# Patient Record
Sex: Female | Born: 1990 | Race: White | Hispanic: No | Marital: Single | Smoking: Never smoker
Health system: Southern US, Community
[De-identification: ages and names within clinical notes are randomized; demographics above are authoritative.]

## PROBLEM LIST (undated history)

## (undated) DIAGNOSIS — F329 Major depressive disorder, single episode, unspecified: Secondary | ICD-10-CM

## (undated) DIAGNOSIS — N289 Disorder of kidney and ureter, unspecified: Secondary | ICD-10-CM

## (undated) DIAGNOSIS — M1990 Unspecified osteoarthritis, unspecified site: Secondary | ICD-10-CM

## (undated) DIAGNOSIS — F419 Anxiety disorder, unspecified: Secondary | ICD-10-CM

## (undated) DIAGNOSIS — F32A Depression, unspecified: Secondary | ICD-10-CM

---
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
## 2010-03-09 ENCOUNTER — Encounter: Payer: Self-pay | Admitting: Orthopedic Surgery

## 2010-10-08 IMAGING — US US EXTREM LOW NON VASC*R*
1 series · 9 of 9 positions shown · non-contrast
Comparison: Ultrasound scanning in similar region of asymptomatic
left wrist.

CLINICAL DATA: Painful palpable bulge at the palmar right wrist.
Assessment for possible cyst.

RIGHT LOWER EXTREMITY SOFT TISSUE ULTRASOUND
TECHNIQUE: Ultrasound examination of the soft tissues was
performed in the area of clinical concern.

[Series 1: us extrem low non vasc*right* · 0.06mm/px · 9 of 9 slices shown]
[im 1/9]
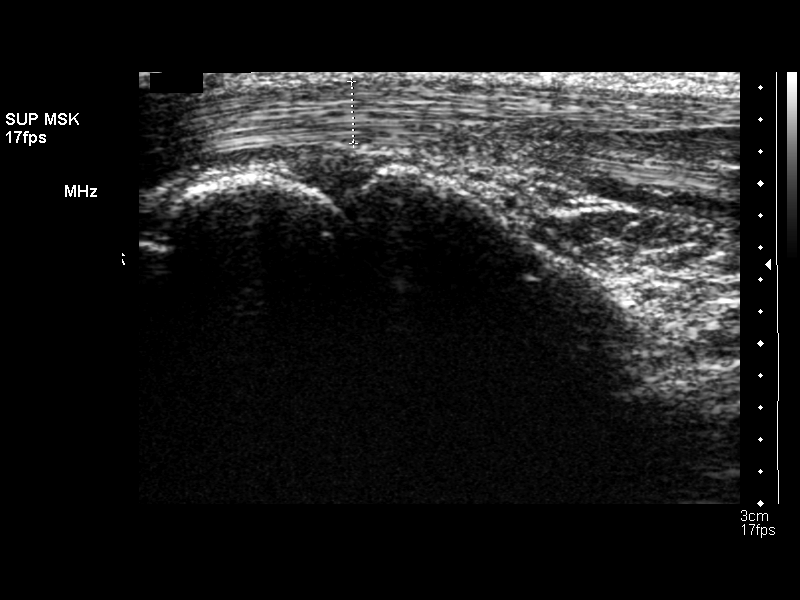
[im 2/9]
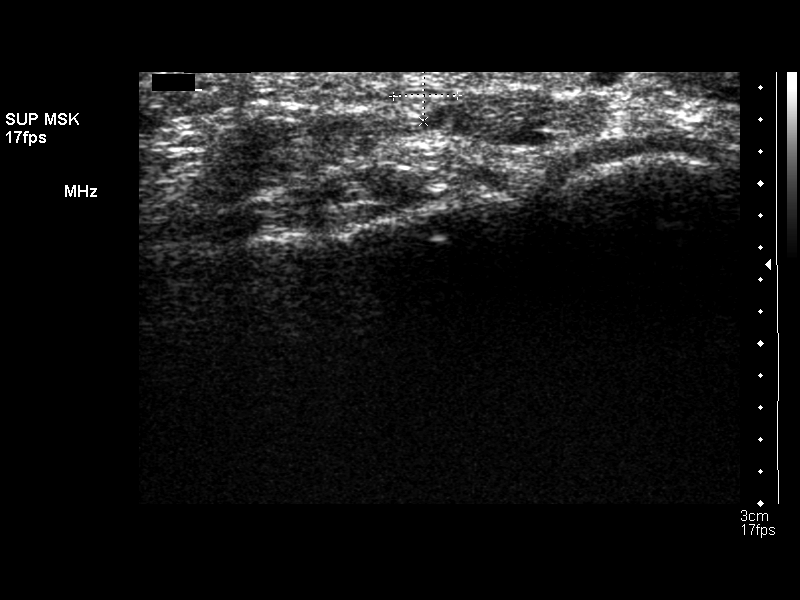
[im 3/9]
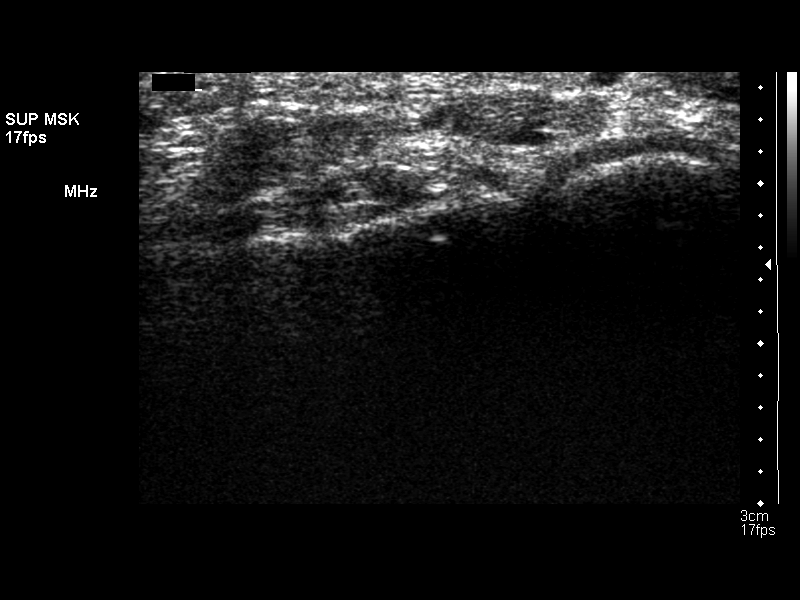
[im 4/9]
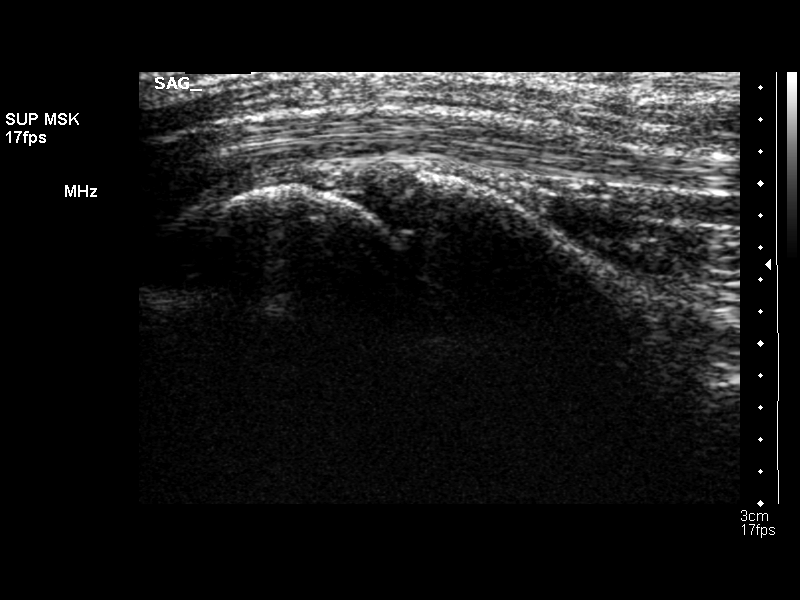
[im 5/9]
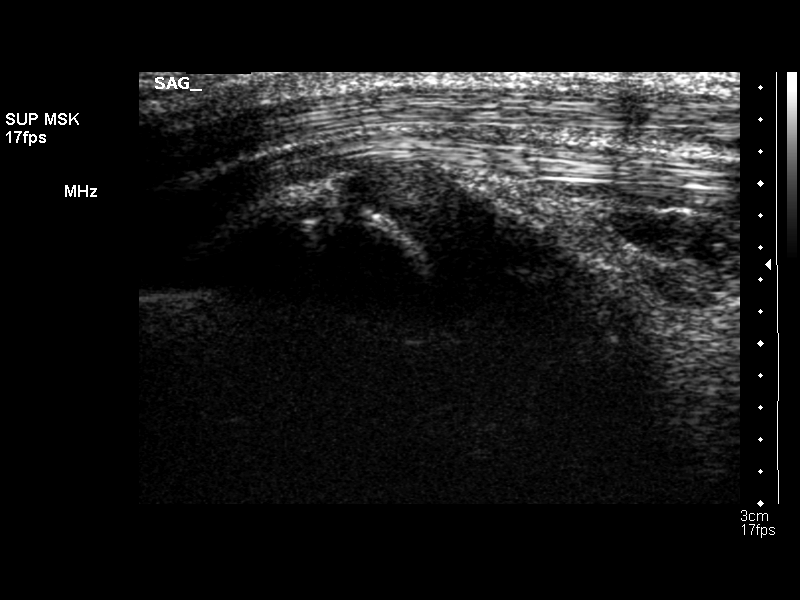
[im 6/9]
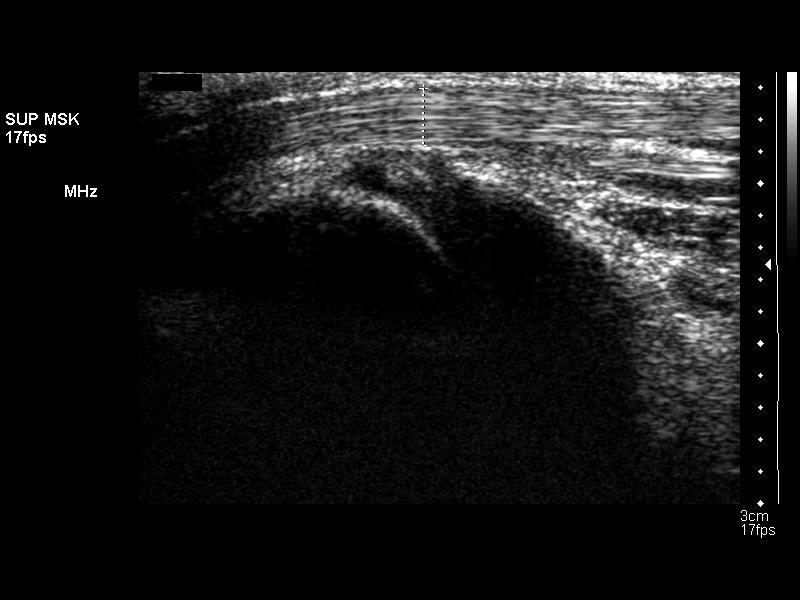
[im 7/9]
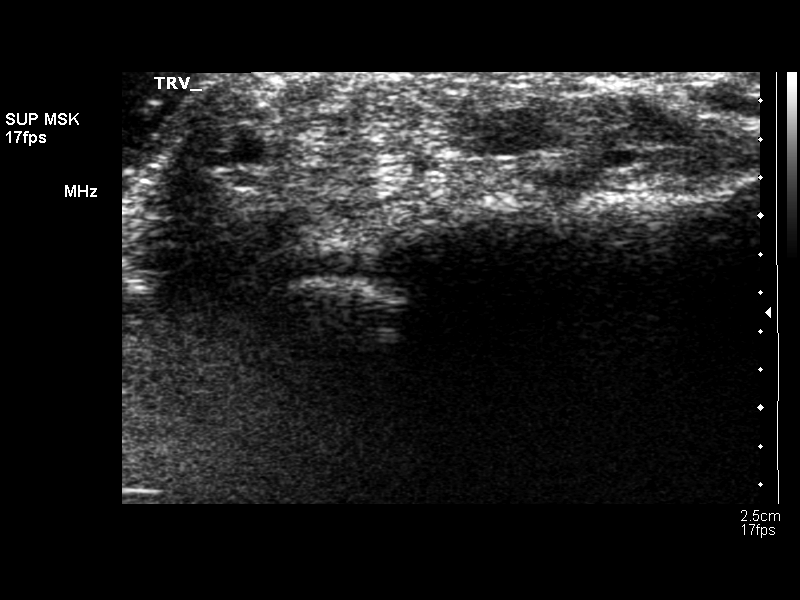
[im 8/9]
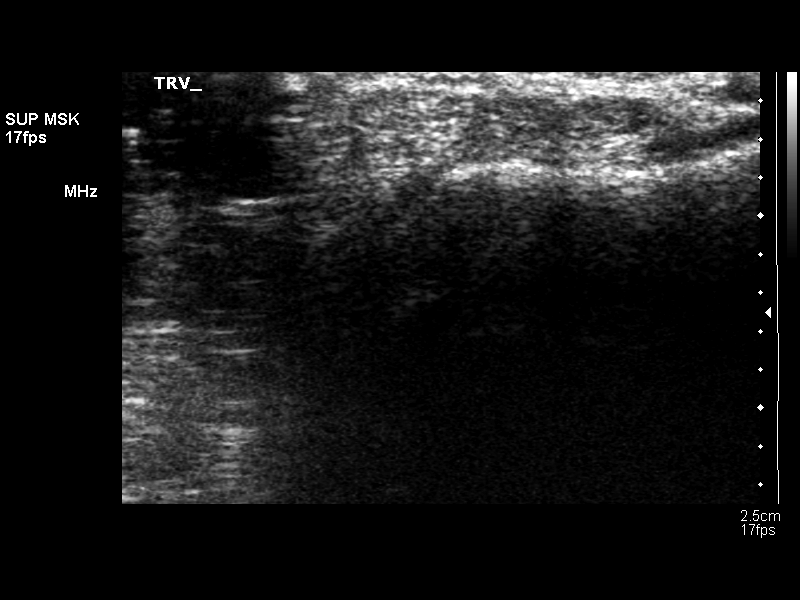
[im 9/9]
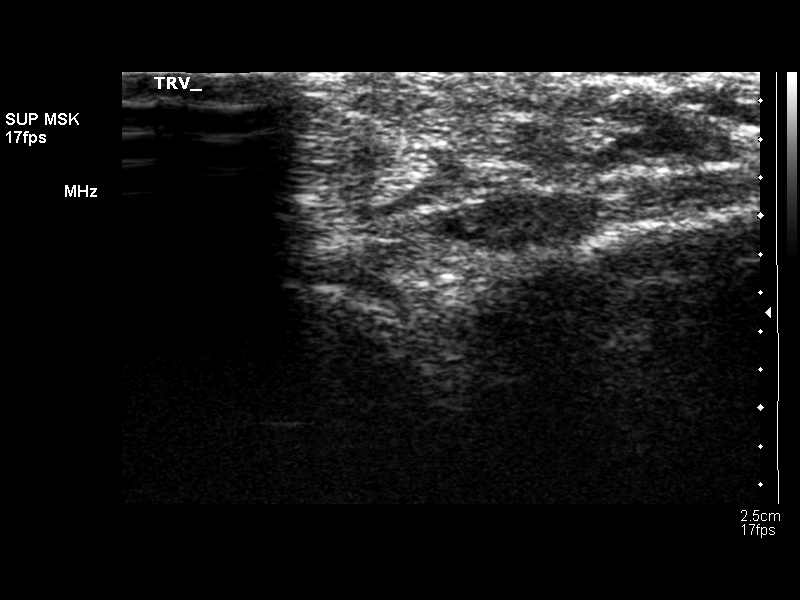

[9 of 9 positions shown; findings below may reference images not displayed]

FINDINGS: No sonographic cystic lesion is seen in region of the
palmar right wrist symptomatic area as indicated by patient.
Bilateral flexor tendons appear symmetrical and sonographically
normal. Normal vascular anatomy visualized.
IMPRESSION: Negative.

## 2018-12-24 ENCOUNTER — Emergency Department
Admission: EM | Admit: 2018-12-24 | Discharge: 2018-12-24 | Disposition: A | Discharge status: Home / Self Care | Payer: Self-pay | Source: Home / Self Care | Attending: Family Medicine | Admitting: Family Medicine

## 2018-12-24 ENCOUNTER — Other Ambulatory Visit: Payer: Self-pay

## 2018-12-24 ENCOUNTER — Encounter: Payer: Self-pay | Admitting: Emergency Medicine

## 2018-12-24 ENCOUNTER — Emergency Department (INDEPENDENT_AMBULATORY_CARE_PROVIDER_SITE_OTHER): Payer: Managed Care, Other (non HMO)

## 2018-12-24 DIAGNOSIS — S92501A Displaced unspecified fracture of right lesser toe(s), initial encounter for closed fracture: Secondary | ICD-10-CM | POA: Diagnosis not present

## 2018-12-24 DIAGNOSIS — S92531A Displaced fracture of distal phalanx of right lesser toe(s), initial encounter for closed fracture: Secondary | ICD-10-CM | POA: Diagnosis not present

## 2018-12-24 DIAGNOSIS — W5519XA Other contact with horse, initial encounter: Secondary | ICD-10-CM

## 2018-12-24 HISTORY — DX: Anxiety disorder, unspecified: F41.9

## 2018-12-24 HISTORY — DX: Depression, unspecified: F32.A

## 2018-12-24 HISTORY — DX: Unspecified osteoarthritis, unspecified site: M19.90

## 2018-12-24 HISTORY — DX: Disorder of kidney and ureter, unspecified: N28.9

## 2018-12-24 NOTE — Discharge Instructions (Addendum)
Apply ice pack for 10 to 15 minutes, 3 to 4 times daily  Continue until pain and swelling decrease. May take Tylenol daytime as necessary for pain. Elevate foot whenever possible.  Wear post-op shoe.  Use crutches.

## 2018-12-24 NOTE — ED Notes (Signed)
Patient does not want Tdap; she says her foot was in sock and boot when horse stepped on it.

## 2018-12-24 NOTE — ED Provider Notes (Signed)
[facility]    CSN: [id number] Arrival date & time: 12/24/18  1003      History   Chief Complaint Chief Complaint  Patient presents with  . Foot Injury    right    HPI Tracy Huffman is a 28 y.o. female.   Patient reports that a horse stepped on her right lateral foot yesterday, and she has had persistent pain/swelling.   Foot Injury Location:  Foot Time since incident:  1 day Injury: yes   Mechanism of injury comment:  Horse stepped on foot Foot location:  Dorsum of R foot Pain details:    Quality:  Aching   Radiates to:  Does not radiate   Severity:  Severe   Onset quality:  Sudden   Duration:  1 day   Timing:  Constant   Progression:  Unchanged Chronicity:  New Prior injury to area:  No Relieved by:  Nothing Worsened by:  Bearing weight Ineffective treatments:  NSAIDs Associated symptoms: decreased ROM, stiffness and swelling   Associated symptoms: no numbness and no tingling     Past Medical History:  Diagnosis Date  . Anxiety and depression   . Arthritis   . Renal disorder    stone x 1    Patient Active Problem List   Diagnosis Date Noted  . ALLERGIC RHINITIS 06/06/2008  . ASTHMA 06/06/2008    History reviewed. No pertinent surgical history.  OB History   No obstetric history on file.      Home Medications    Prior to Admission medications   Medication Sig Start Date End Date Taking? Authorizing Provider  busPIRone (BUSPAR) 7.5 MG tablet Take 7.5 mg by mouth 3 (three) times daily.   Yes [provider]  diclofenac (CATAFLAM) 50 MG tablet Take 75 mg by mouth 3 (three) times daily.   Yes [provider]  etonogestrel (NEXPLANON) 68 MG IMPL implant 1 each by Subdermal route once.   Yes [provider]  sertraline (ZOLOFT) 100 MG tablet Take 100 mg by mouth daily.   Yes [provider]    Family History No family history on file.  Social History Social History   Tobacco Use  .  Smoking status: Never Smoker  . Smokeless tobacco: Never Used  Substance Use Topics  . Alcohol use: Never    Frequency: Never  . Drug use: Not on file     Allergies   Erythromycin   Review of Systems Review of Systems  Musculoskeletal: Positive for stiffness.  All other systems reviewed and are negative.    Physical Exam Triage Vital Signs ED Triage Vitals  Enc Vitals Group     BP 12/24/18 1041 106/72     Pulse Rate 12/24/18 1041 86     Resp 12/24/18 1041 16     Temp 12/24/18 1041 98.5 F (36.9 C)     Temp Source 12/24/18 1041 Oral     SpO2 12/24/18 1041 98 %     Weight 12/24/18 1042 145 lb (65.8 kg)     Height 12/24/18 1042 5' 1.5" (1.562 m)     Head Circumference --      Peak Flow --      Pain Score 12/24/18 1042 7     Pain Loc --      Pain Edu? --      Excl. in [place]? --    No data found.  Updated Vital Signs BP 106/72 (BP Location: Right Arm)   Pulse  86   Temp 98.5 F (36.9 C) (Oral)   Resp 16   Ht 5' 1.5" (1.562 m)   Wt 65.8 kg   SpO2 98%   BMI 26.95 kg/m   Visual Acuity Right Eye Distance:   Left Eye Distance:   Bilateral Distance:    Right Eye Near:   Left Eye Near:    Bilateral Near:     Physical Exam Vitals signs and nursing note reviewed.  Constitutional:      General: She is not in acute distress. HENT:     Head: Normocephalic.  Eyes:     Pupils: Pupils are equal, round, and reactive to light.  Cardiovascular:     Rate and Rhythm: Normal rate.  Pulmonary:     Effort: Pulmonary effort is normal.  Musculoskeletal:     Right foot: Decreased range of motion. Tenderness, bony tenderness and swelling present.       Feet:     Comments: Fifth toe of right foot has swelling and tenderness to palpation over the distal phalanx.  Neurovascular function is intact.   Skin:    General: Skin is warm and dry.  Neurological:     Mental Status: She is alert.      UC Treatments / Results  Labs (all labs ordered are listed, but only  abnormal results are displayed) Labs Reviewed - No data to display  EKG   Radiology Dg Foot Complete Right  Result Date: 12/24/2018 CLINICAL DATA:  Horse stepped on right foot yesterday with fifth toe pain and swelling. EXAM: RIGHT FOOT COMPLETE - 3+ VIEW COMPARISON:  None. FINDINGS: Examination demonstrates a displaced transverse fracture through the midportion of the fifth distal phalanx. Remainder of the exam is unremarkable. IMPRESSION: Minimally displaced transverse fracture of the fifth distal phalanx. Electronically Signed   By: Elberta Fortis M.D.   On: 12/24/2018 11:24    Procedures Procedures (including critical care time)  Medications Ordered in UC Medications - No data to display  Initial Impression / Assessment and Plan / UC Course  I have reviewed the triage vital signs and the nursing notes.  Pertinent labs & imaging results that were available during my care of the patient were reviewed by me and considered in my medical decision making (see chart for details).    Dispensed post-op shoe and crutches. Followup with Dr. [facility] ([facility] in about 3 days.   Final Clinical Impressions(s) / UC Diagnoses   Final diagnoses:  Closed fracture of phalanx of right fifth toe, initial encounter     Discharge Instructions     Apply ice pack for 10 to 15 minutes, 3 to 4 times daily  Continue until pain and swelling decrease. May take Tylenol daytime as necessary for pain. Elevate foot whenever possible.  Wear post-op shoe.  Use crutches.    ED Prescriptions    None        Lattie Haw, MD 12/29/18 1327

## 2018-12-24 NOTE — ED Triage Notes (Signed)
Patient here for right foot injury; horse stepped on it yesterday. Uncertain about last Tdap and there is abrasion on small toe. She took her diclofenac for pain this morning; she is in wheelchair. She has not had her influenza vacc this season.

## 2020-12-21 IMAGING — DX DG FOOT COMPLETE 3+V*R*
2 series · 2 of 2 positions shown · non-contrast
Comparison: None.

CLINICAL DATA: Horse stepped on right foot yesterday with fifth toe
pain and swelling.

EXAM:
RIGHT FOOT COMPLETE - 3+ VIEW

[foot obl]
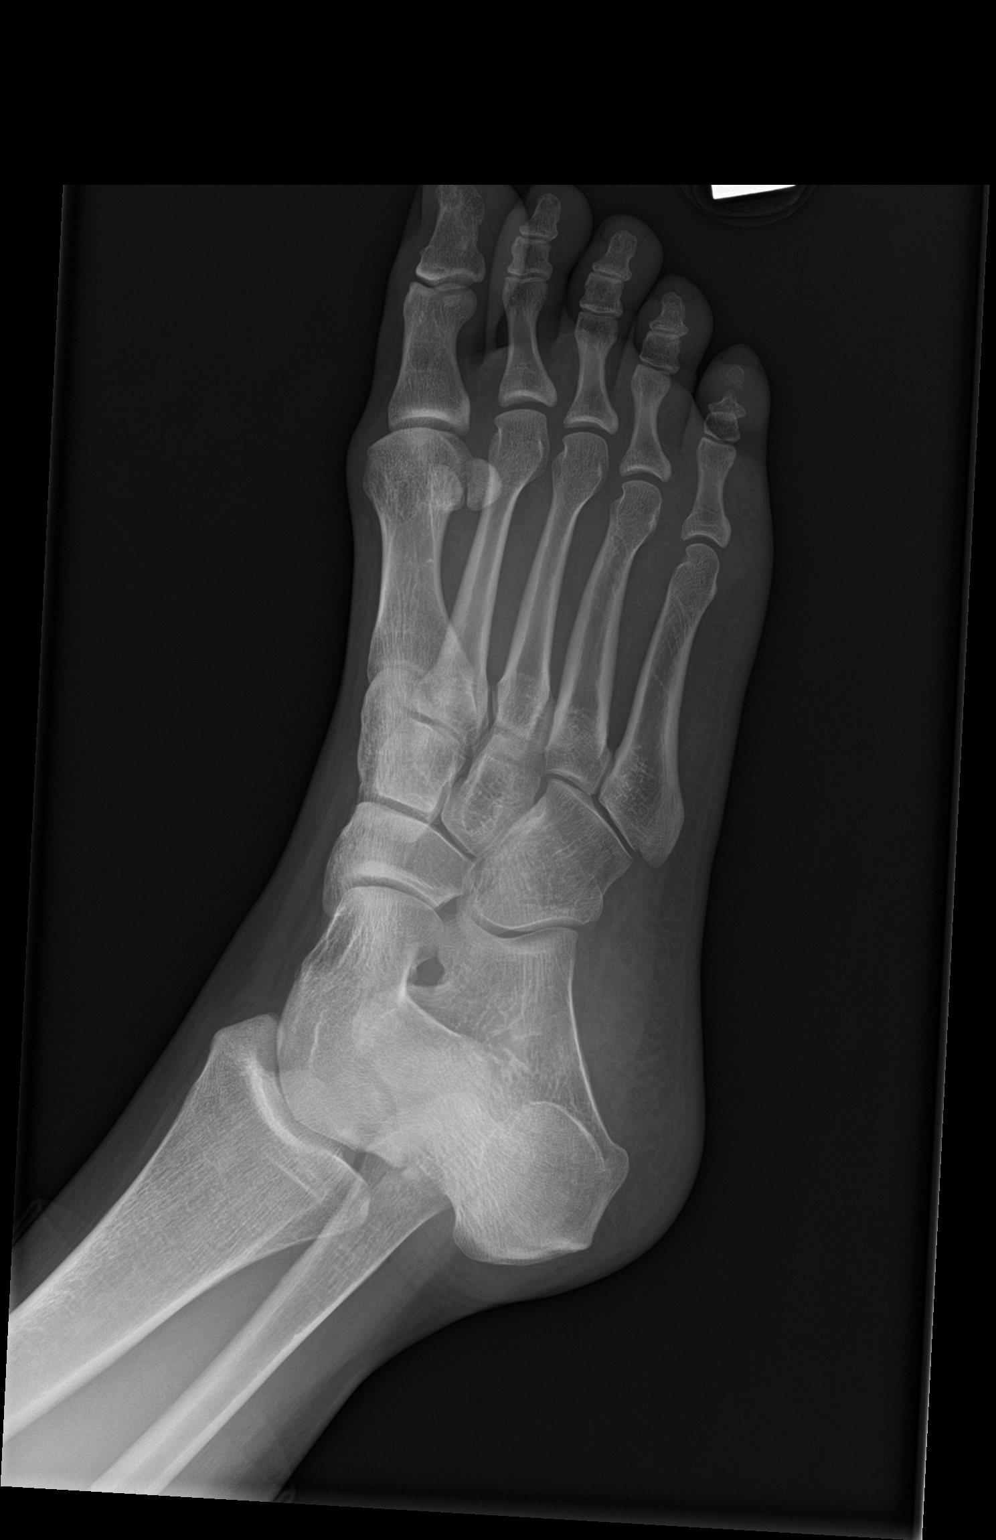

[foot lat]
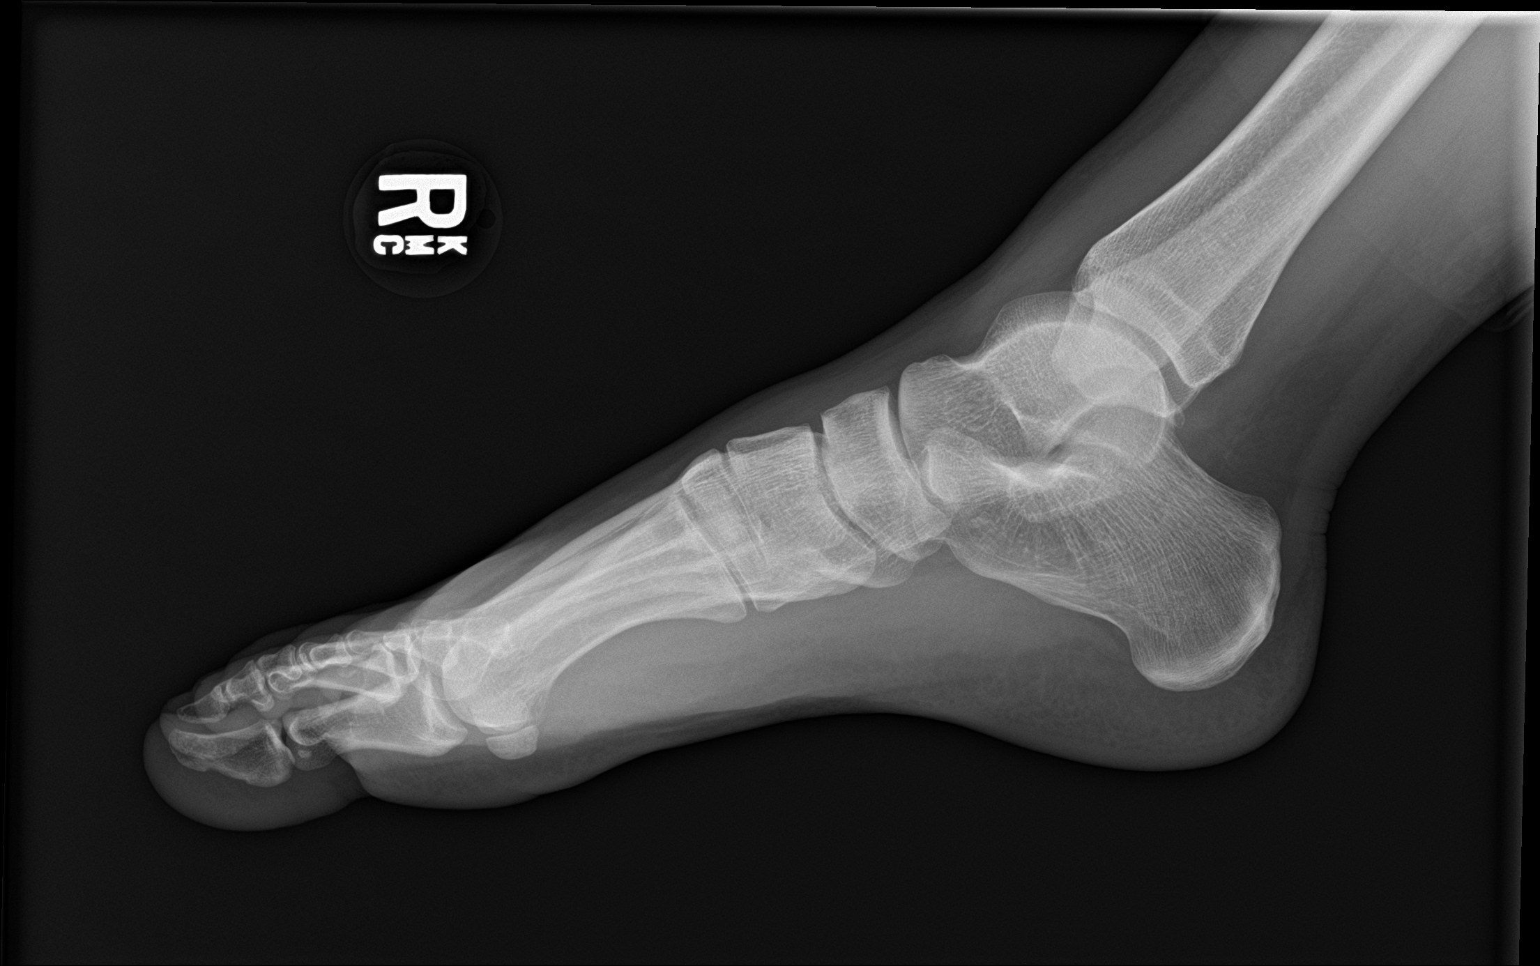

[2 of 2 positions shown; findings below may reference images not displayed]

FINDINGS: Examination demonstrates a displaced transverse fracture through the
midportion of the fifth distal phalanx. Remainder of the exam is
unremarkable.
IMPRESSION: Minimally displaced transverse fracture of the fifth distal phalanx.
